# Patient Record
Sex: Male | Born: 1995 | Race: White | Hispanic: No | Marital: Married
Health system: Southern US, Community
[De-identification: ages and names within clinical notes are randomized; demographics above are authoritative.]

---

## 2018-11-29 ENCOUNTER — Emergency Department (HOSPITAL_COMMUNITY): Payer: BLUE CROSS/BLUE SHIELD

## 2018-11-29 ENCOUNTER — Encounter (HOSPITAL_COMMUNITY): Payer: Self-pay

## 2018-11-29 ENCOUNTER — Other Ambulatory Visit: Payer: Self-pay

## 2018-11-29 ENCOUNTER — Observation Stay (HOSPITAL_COMMUNITY)
Admission: EM | Admit: 2018-11-29 | Discharge: 2018-12-01 | Disposition: A | Discharge status: Home / Self Care | Payer: BLUE CROSS/BLUE SHIELD | Attending: Physician Assistant | Admitting: Physician Assistant

## 2018-11-29 DIAGNOSIS — S82142A Displaced bicondylar fracture of left tibia, initial encounter for closed fracture: Secondary | ICD-10-CM | POA: Diagnosis not present

## 2018-11-29 DIAGNOSIS — S0990XA Unspecified injury of head, initial encounter: Secondary | ICD-10-CM | POA: Diagnosis present

## 2018-11-29 DIAGNOSIS — R2689 Other abnormalities of gait and mobility: Secondary | ICD-10-CM | POA: Diagnosis not present

## 2018-11-29 DIAGNOSIS — T07XXXA Unspecified multiple injuries, initial encounter: Secondary | ICD-10-CM | POA: Diagnosis present

## 2018-11-29 DIAGNOSIS — Z23 Encounter for immunization: Secondary | ICD-10-CM | POA: Diagnosis not present

## 2018-11-29 DIAGNOSIS — S0101XA Laceration without foreign body of scalp, initial encounter: Secondary | ICD-10-CM | POA: Diagnosis not present

## 2018-11-29 DIAGNOSIS — S060X0A Concussion without loss of consciousness, initial encounter: Secondary | ICD-10-CM | POA: Diagnosis present

## 2018-11-29 DIAGNOSIS — R2681 Unsteadiness on feet: Secondary | ICD-10-CM | POA: Insufficient documentation

## 2018-11-29 DIAGNOSIS — S82401A Unspecified fracture of shaft of right fibula, initial encounter for closed fracture: Principal | ICD-10-CM

## 2018-11-29 DIAGNOSIS — M6281 Muscle weakness (generalized): Secondary | ICD-10-CM | POA: Insufficient documentation

## 2018-11-29 DIAGNOSIS — S82102A Unspecified fracture of upper end of left tibia, initial encounter for closed fracture: Secondary | ICD-10-CM

## 2018-11-29 LAB — SAMPLE TO BLOOD BANK

## 2018-11-29 LAB — CBC
HCT: 43.9 % (ref 39.0–52.0)
Hemoglobin: 15.1 g/dL (ref 13.0–17.0)
MCH: 31.9 pg (ref 26.0–34.0)
MCHC: 34.4 g/dL (ref 30.0–36.0)
MCV: 92.6 fL (ref 80.0–100.0)
Platelets: 188 10*3/uL (ref 150–400)
RBC: 4.74 MIL/uL (ref 4.22–5.81)
RDW: 12.5 % (ref 11.5–15.5)
WBC: 6.1 10*3/uL (ref 4.0–10.5)
nRBC: 0 % (ref 0.0–0.2)

## 2018-11-29 LAB — I-STAT CHEM 8, ED
BUN: 17 mg/dL (ref 6–20)
Calcium, Ion: 1.18 mmol/L (ref 1.15–1.40)
Chloride: 103 mmol/L (ref 98–111)
Creatinine, Ser: 0.8 mg/dL (ref 0.61–1.24)
GLUCOSE: 101 mg/dL — AB (ref 70–99)
HCT: 45 % (ref 39.0–52.0)
Hemoglobin: 15.3 g/dL (ref 13.0–17.0)
Potassium: 3.5 mmol/L (ref 3.5–5.1)
Sodium: 140 mmol/L (ref 135–145)
TCO2: 28 mmol/L (ref 22–32)

## 2018-11-29 LAB — COMPREHENSIVE METABOLIC PANEL
ALK PHOS: 56 U/L (ref 38–126)
ALT: 25 U/L (ref 0–44)
AST: 54 U/L — ABNORMAL HIGH (ref 15–41)
Albumin: 4.3 g/dL (ref 3.5–5.0)
Anion gap: 11 (ref 5–15)
BUN: 12 mg/dL (ref 6–20)
CO2: 23 mmol/L (ref 22–32)
Calcium: 9.4 mg/dL (ref 8.9–10.3)
Chloride: 105 mmol/L (ref 98–111)
Creatinine, Ser: 0.9 mg/dL (ref 0.61–1.24)
GFR calc Af Amer: >60 mL/min (ref >60)
GFR calc non Af Amer: >60 mL/min (ref >60)
GLUCOSE: 98 mg/dL (ref 70–99)
Potassium: 3.6 mmol/L (ref 3.5–5.1)
Sodium: 139 mmol/L (ref 135–145)
Total Bilirubin: 0.8 mg/dL (ref 0.3–1.2)
Total Protein: 6.9 g/dL (ref 6.5–8.1)

## 2018-11-29 LAB — PROTIME-INR
INR: 0.98
Prothrombin Time: 12.9 seconds (ref 11.4–15.2)

## 2018-11-29 LAB — ETHANOL: Alcohol, Ethyl (B): <10 mg/dL (ref <10)

## 2018-11-29 LAB — I-STAT CG4 LACTIC ACID, ED: Lactic Acid, Venous: 1.44 mmol/L (ref 0.5–1.9)

## 2018-11-29 LAB — CDS SEROLOGY

## 2018-11-29 MED ORDER — TETANUS-DIPHTH-ACELL PERTUSSIS 5-2.5-18.5 LF-MCG/0.5 IM SUSP
0.5000 mL | Freq: Once | INTRAMUSCULAR | Status: AC
  Administered 2018-11-29: 0.5 mL via INTRAMUSCULAR
  Filled 2018-11-29: qty 0.5

## 2018-11-29 MED ORDER — MORPHINE SULFATE (PF) 4 MG/ML IV SOLN
4.0000 mg | Freq: Once | INTRAVENOUS | Status: AC
  Administered 2018-11-29: 4 mg via INTRAVENOUS
  Filled 2018-11-29: qty 1

## 2018-11-29 MED ORDER — LORAZEPAM 2 MG/ML IJ SOLN
0.5000 mg | Freq: Once | INTRAMUSCULAR | Status: AC
  Administered 2018-11-29: 0.5 mg via INTRAVENOUS
  Filled 2018-11-29: qty 1

## 2018-11-29 MED ORDER — LIDOCAINE-EPINEPHRINE (PF) 2 %-1:200000 IJ SOLN
20.0000 mL | Freq: Once | INTRAMUSCULAR | Status: AC
  Administered 2018-11-29: 20 mL via INTRADERMAL
  Filled 2018-11-29: qty 20

## 2018-11-29 MED ORDER — ONDANSETRON HCL 4 MG/2ML IJ SOLN: 4.0000 mg | Freq: Once | INTRAMUSCULAR | Status: DC

## 2018-11-29 MED ORDER — ONDANSETRON HCL 4 MG/2ML IJ SOLN
4.0000 mg | Freq: Once | INTRAMUSCULAR | Status: AC
  Administered 2018-11-29: 4 mg via INTRAVENOUS
  Filled 2018-11-29: qty 2

## 2018-11-29 MED ORDER — IOHEXOL 300 MG/ML  SOLN
100.0000 mL | Freq: Once | INTRAMUSCULAR | Status: AC | PRN
  Administered 2018-11-29: 100 mL via INTRAVENOUS

## 2018-11-29 MED ORDER — HYDROMORPHONE HCL 1 MG/ML IJ SOLN
1.0000 mg | Freq: Once | INTRAMUSCULAR | Status: AC
  Administered 2018-11-29: 1 mg via INTRAVENOUS
  Filled 2018-11-29: qty 1

## 2018-11-29 MED ORDER — SODIUM CHLORIDE 0.9 % IV BOLUS
1000.0000 mL | Freq: Once | INTRAVENOUS | Status: AC
  Administered 2018-11-29: 1000 mL via INTRAVENOUS

## 2018-11-29 NOTE — ED Notes (Signed)
Pt returned from radiology.

## 2018-11-29 NOTE — ED Notes (Addendum)
Pt has a 2 cm laceration to the left side of the head, bleeding controlled.  PT has a 3-4 cm laceration to the left side of the forehead, bleeding controlled.  PT has laceration/puncture wound to left elbow, bleeding controlled.  PT has superficial lacerations to lower lip.  PT has superficial abrasion to left leg distal to knee.  PT has ecchymosis to left chest next to shoulder.
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
## 2018-11-30 ENCOUNTER — Other Ambulatory Visit: Payer: Self-pay

## 2018-11-30 ENCOUNTER — Encounter (HOSPITAL_COMMUNITY): Payer: Self-pay | Admitting: Emergency Medicine

## 2018-11-30 LAB — CBC
HCT: 39.1 % (ref 39.0–52.0)
Hemoglobin: 13.1 g/dL (ref 13.0–17.0)
MCH: 30.8 pg (ref 26.0–34.0)
MCHC: 33.5 g/dL (ref 30.0–36.0)
MCV: 91.8 fL (ref 80.0–100.0)
PLATELETS: 175 10*3/uL (ref 150–400)
RBC: 4.26 MIL/uL (ref 4.22–5.81)
RDW: 12.7 % (ref 11.5–15.5)
WBC: 11.7 10*3/uL — ABNORMAL HIGH (ref 4.0–10.5)
nRBC: 0 % (ref 0.0–0.2)

## 2018-11-30 LAB — BASIC METABOLIC PANEL
Anion gap: 8 (ref 5–15)
BUN: 11 mg/dL (ref 6–20)
CO2: 25 mmol/L (ref 22–32)
Calcium: 8.9 mg/dL (ref 8.9–10.3)
Chloride: 104 mmol/L (ref 98–111)
Creatinine, Ser: 0.89 mg/dL (ref 0.61–1.24)
GFR calc Af Amer: >60 mL/min (ref >60)
Glucose, Bld: 123 mg/dL — ABNORMAL HIGH (ref 70–99)
Potassium: 3.6 mmol/L (ref 3.5–5.1)
Sodium: 137 mmol/L (ref 135–145)

## 2018-11-30 LAB — URINALYSIS, ROUTINE W REFLEX MICROSCOPIC
Bilirubin Urine: NEGATIVE
Glucose, UA: NEGATIVE mg/dL
HGB URINE DIPSTICK: NEGATIVE
Ketones, ur: NEGATIVE mg/dL
Leukocytes, UA: NEGATIVE
Nitrite: NEGATIVE
Protein, ur: NEGATIVE mg/dL
Specific Gravity, Urine: 1.019 (ref 1.005–1.030)
pH: 6 (ref 5.0–8.0)

## 2018-11-30 LAB — HIV ANTIBODY (ROUTINE TESTING W REFLEX): HIV Screen 4th Generation wRfx: NONREACTIVE

## 2018-11-30 MED ORDER — POLYETHYLENE GLYCOL 3350 17 G PO PACK
17.0000 g | PACK | Freq: Every day | ORAL | Status: DC
  Administered 2018-11-30 – 2018-12-01 (×2): 17 g via ORAL
  Filled 2018-11-30 (×2): qty 1

## 2018-11-30 MED ORDER — DOCUSATE SODIUM 100 MG PO CAPS
100.0000 mg | ORAL_CAPSULE | Freq: Two times a day (BID) | ORAL | Status: DC
  Administered 2018-11-30 – 2018-12-01 (×3): 100 mg via ORAL
  Filled 2018-11-30 (×3): qty 1

## 2018-11-30 MED ORDER — BACITRACIN ZINC 500 UNIT/GM EX OINT
TOPICAL_OINTMENT | Freq: Two times a day (BID) | CUTANEOUS | Status: DC
  Administered 2018-11-30 – 2018-12-01 (×3): via TOPICAL
  Filled 2018-11-30: qty 28.4

## 2018-11-30 MED ORDER — OXYCODONE HCL 5 MG PO TABS
5.0000 mg | ORAL_TABLET | ORAL | Status: DC | PRN
  Administered 2018-11-30 – 2018-12-01 (×4): 10 mg via ORAL
  Filled 2018-11-30 (×4): qty 2

## 2018-11-30 MED ORDER — ONDANSETRON HCL 4 MG/2ML IJ SOLN: 4.0000 mg | Freq: Four times a day (QID) | INTRAMUSCULAR | Status: DC | PRN

## 2018-11-30 MED ORDER — ENOXAPARIN SODIUM 40 MG/0.4ML ~~LOC~~ SOLN
40.0000 mg | SUBCUTANEOUS | Status: DC
  Administered 2018-11-30: 40 mg via SUBCUTANEOUS
  Filled 2018-11-30: qty 0.4

## 2018-11-30 MED ORDER — DEXTROSE IN LACTATED RINGERS 5 % IV SOLN
INTRAVENOUS | Status: DC
  Administered 2018-11-30 (×2): via INTRAVENOUS

## 2018-11-30 MED ORDER — ONDANSETRON 4 MG PO TBDP: 4.0000 mg | ORAL_TABLET | Freq: Four times a day (QID) | ORAL | Status: DC | PRN

## 2018-11-30 MED ORDER — ACETAMINOPHEN 325 MG PO TABS
650.0000 mg | ORAL_TABLET | Freq: Four times a day (QID) | ORAL | Status: DC
  Administered 2018-11-30 – 2018-12-01 (×5): 650 mg via ORAL
  Filled 2018-11-30 (×5): qty 2

## 2018-11-30 MED ORDER — HYDROMORPHONE HCL 1 MG/ML IJ SOLN: 1.0000 mg | INTRAMUSCULAR | Status: DC | PRN

## 2018-11-30 MED ORDER — METHOCARBAMOL 500 MG PO TABS
500.0000 mg | ORAL_TABLET | Freq: Four times a day (QID) | ORAL | Status: DC
  Administered 2018-11-30 – 2018-12-01 (×5): 500 mg via ORAL
  Filled 2018-11-30 (×5): qty 1

## 2018-11-30 MED ORDER — HYDROMORPHONE HCL 1 MG/ML IJ SOLN
1.0000 mg | INTRAMUSCULAR | Status: DC | PRN
  Administered 2018-11-30: 1 mg via INTRAVENOUS
  Filled 2018-11-30: qty 1

## 2018-11-30 MED ORDER — OXYCODONE HCL 5 MG PO TABS
10.0000 mg | ORAL_TABLET | ORAL | Status: DC | PRN
  Administered 2018-11-30: 10 mg via ORAL
  Filled 2018-11-30: qty 2
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
## 2018-11-30 NOTE — Plan of Care (Signed)
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
## 2018-11-30 NOTE — ED Notes (Signed)
Report given to 5N RN. All questions answered 
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
## 2018-11-30 NOTE — Evaluation (Signed)
Physical Therapy Evaluation Patient Details Name: Bill Barnes MRN: [id number] DOB: Jan 10, 1996 Today's Date: 11/30/2018   History of Present Illness  Patient is a 23 y/o male presenting to the ED on 11/29/18 due to [facility] with resultant Right fibular fracture, Left tibial plateau fracture, Concussion, Facial lacerations. No significant PMH.     Clinical Impression  Patient admitted with the above listed diagnosis. IND with all mobility and ADLs prior to admission. Today requiring close min guard with consistent cueing throughout session with all mobility for safety and sequencing with limited carryover due to delayed recall and reduced short term memory - likely an effect from concussion. Wife present for session with wife educated on safety with all mobility with handouts and gait belt distributed - will need supervision from family for all mobility due to safety concerns and reduced memory. Able to ambulate in hallway and up/down 2 steps x 2 with B axillary crutches. Depending on POC for MD follow-up at discharge may benefit from OPPT to progress gait mechanics and weight bearing. PT to continue to follow.     Follow Up Recommendations Outpatient PT;Follow surgeon's recommendation for DC plan and follow-up therapies;Supervision for mobility/OOB    Equipment Recommendations  3in1 (PT)    Recommendations for Other Services       Precautions / Restrictions Precautions Precautions: Fall Required Braces or Orthoses: Knee Immobilizer - Left Restrictions Weight Bearing Restrictions: Yes RLE Weight Bearing: Weight bearing as tolerated LLE Weight Bearing: Weight bearing as tolerated      Mobility  Bed Mobility Overal bed mobility: Needs Assistance Bed Mobility: Supine to Sit     Supine to sit: Min assist     General bed mobility comments: light Min A for LE management  Transfers Overall transfer level: Needs assistance Equipment used: Crutches Transfers: Sit to/from [employer] Sit to Stand: Min guard Stand pivot transfers: Min guard       General transfer comment: min guard for safety; consistent verbal cueing for safety/sequencing  Ambulation/Gait Ambulation/Gait assistance: Min guard Gait Distance (Feet): 100 Feet Assistive device: Crutches Gait Pattern/deviations: Step-to pattern;Decreased weight shift to left(l[place] to L LE weight bearing ) Gait velocity: decreased   General Gait Details: consistent cueing for safety and sequencing  Stairs Stairs: Yes Stairs assistance: Min guard;Min assist Stair Management: No rails;With crutches;Step to pattern Number of Stairs: 2(2 reps) General stair comments: step to pattern with R LE weight bearing only; cueing for safety and sequencing; delayed recall; handout given   Wheelchair Mobility    Modified Rankin (Stroke Patients Only)       Balance Overall balance assessment: Mild deficits observed, not formally tested                                           Pertinent Vitals/Pain Pain Assessment: Faces Faces Pain Scale: Hurts even more Pain Location: L knee Pain Descriptors / Indicators: Aching;Discomfort;Grimacing;Guarding Pain Intervention(s): Monitored during session;Limited activity within patient's tolerance;Repositioned    Home Living Family/patient expects to be discharged to:: Private residence Living Arrangements: Spouse/significant other Available Help at Discharge: Family Type of Home: House Home Access: Stairs to enter(3 steps) Entrance Stairs-Rails: Left Entrance Stairs-Number of Steps: 3 Home Layout: One level Home Equipment: Crutches;Walker - 2 wheels      Prior Function Level of Independence: Independent  Hand Dominance   Dominant Hand: Right    Extremity/Trunk Assessment   Upper Extremity Assessment Upper Extremity Assessment: Defer to OT evaluation    Lower Extremity Assessment Lower Extremity  Assessment: Generalized weakness;RLE deficits/detail;LLE deficits/detail RLE Deficits / Details: good ability to bear weight  LLE Deficits / Details: does not tolerate weight bearing    Cervical / Trunk Assessment Cervical / Trunk Assessment: Normal  Communication   Communication: No difficulties  Cognition Arousal/Alertness: Awake/alert Behavior During Therapy: WFL for tasks assessed/performed Overall Cognitive Status: Impaired/Different from baseline Area of Impairment: Memory;Following commands;Safety/judgement;Problem solving                     Memory: Decreased short-term memory(Able to recall Monday event and before.) Following Commands: Follows one step commands consistently;Follows multi-step commands with increased time Safety/Judgement: Decreased awareness of safety;Decreased awareness of deficits   Problem Solving: Slow processing;Difficulty sequencing;Requires verbal cues General Comments: patient with delayed recall of sequencing; requires consistent verbal cueing       General Comments General comments (skin integrity, edema, etc.): wife present for session with wife educated on all mobility    Exercises     Assessment/Plan    PT Assessment Patient needs continued PT services  PT Problem List Decreased strength;Decreased activity tolerance;Decreased balance;Decreased mobility;Decreased knowledge of use of DME;Decreased safety awareness       PT Treatment Interventions DME instruction;Gait training;Stair training;Functional mobility training;Therapeutic activities;Therapeutic exercise;Balance training;Patient/family education    PT Goals (Current goals can be found in the Care Plan section)  Acute Rehab PT Goals Patient Stated Goal: return home today PT Goal Formulation: With patient Time For Goal Achievement: 12/14/18 Potential to Achieve Goals: Good    Frequency Min 3X/week   Barriers to discharge        Co-evaluation PT/OT/SLP  Co-Evaluation/Treatment: Yes Reason for Co-Treatment: Complexity of the patient's impairments (multi-system involvement);Necessary to address cognition/behavior during functional activity;For patient/therapist safety;To address functional/ADL transfers PT goals addressed during session: Mobility/safety with mobility;Balance;Proper use of DME;Strengthening/ROM         AM-PAC PT "6 Clicks" Mobility  Outcome Measure Help needed turning from your back to your side while in a flat bed without using bedrails?: A Little Help needed moving from lying on your back to sitting on the side of a flat bed without using bedrails?: A Little Help needed moving to and from a bed to a chair (including a wheelchair)?: A Little Help needed standing up from a chair using your arms (e.g., wheelchair or bedside chair)?: A Little Help needed to walk in hospital room?: A Little Help needed climbing 3-5 steps with a railing? : A Little 6 Click Score: 18    End of Session Equipment Utilized During Treatment: Gait belt Activity Tolerance: Patient tolerated treatment well Patient left: in chair;with call bell/phone within reach;with family/visitor present Nurse Communication: Mobility status PT Visit Diagnosis: Unsteadiness on feet (R26.81);Other abnormalities of gait and mobility (R26.89);Muscle weakness (generalized) (M62.81)    Time: 9163-8466 PT Time Calculation (min) (ACUTE ONLY): 41 min   Charges:   PT Evaluation $PT Eval Moderate Complexity: 1 Mod PT Treatments $Gait Training: 8-22 mins        Kipp Laurence, PT, DPT Supplemental Physical Therapist 11/30/18 1:23 PM Pager: [phone] Office: [phone]
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
## 2018-12-01 MED ORDER — OXYCODONE HCL 5 MG PO TABS: 5.0000 mg | ORAL_TABLET | Freq: Four times a day (QID) | ORAL | 0 refills | Status: AC | PRN

## 2018-12-01 MED ORDER — POLYETHYLENE GLYCOL 3350 17 G PO PACK: 17.0000 g | PACK | Freq: Every day | ORAL | 0 refills | Status: AC | PRN

## 2018-12-01 MED ORDER — ACETAMINOPHEN 325 MG PO TABS: 650.0000 mg | ORAL_TABLET | Freq: Four times a day (QID) | ORAL | Status: AC | PRN

## 2018-12-01 MED ORDER — METHOCARBAMOL 500 MG PO TABS: 500.0000 mg | ORAL_TABLET | Freq: Four times a day (QID) | ORAL | 0 refills | Status: AC | PRN

## 2018-12-01 MED ORDER — DOCUSATE SODIUM 100 MG PO CAPS: 100.0000 mg | ORAL_CAPSULE | Freq: Two times a day (BID) | ORAL | 0 refills | Status: AC

## 2018-12-01 MED ORDER — BACITRACIN ZINC 500 UNIT/GM EX OINT: TOPICAL_OINTMENT | Freq: Two times a day (BID) | CUTANEOUS | 0 refills | Status: AC

## 2018-12-01 NOTE — Progress Notes (Signed)
Discharge instructions (including medications) discussed with and copy provided to patient/caregiver 

## 2018-12-01 NOTE — Discharge Instructions (Signed)
Pain Medicine Instructions You may need pain medicine after an injury or illness. Two common types of pain medicine are:  Opioid pain medicine. These may be called opioids.  Non-opioid pain medicine. This includes NSAIDs. It is important to follow your doctor's instructions when you are taking pain medicine. Doing this can keep yourself and others safe. How can pain medicine affect me? Pain medicine may not make all of your pain go away. It should make you comfortable enough to:  Move.  Breathe.  Do normal activities. Opioids can cause side effects, such as:  Trouble pooping (constipation).  Feeling sick to your stomach (nausea).  Throwing up (vomiting).  Feeling very sleepy.  Confusion.  Taking the medicine for nonmedical reasons even though taking it hurts your health and well-being (opioid use disorder).  Trouble breathing (respiratory depression). Taking opioids for longer than 3 days raises your risk of these side effects. Taking opioids for a long time can affect how well you can do daily tasks. Taking them for a long time also puts you at risk for:  Car crashes.  Depression.  Suicide.  Heart attack.  Taking too much of the medicine (overdose). This can lead to death. What should I do to stay safe while taking pain medicine? Take your medicine as told  Take pain medicine exactly as told by your doctor. Take it only when you need it.  Write down the times when you take your pain medicine. Look at the times before you take your next dose.  Take other over-the-counter or prescription medicines only as told by your doctor. ? If your pain medicine has acetaminophen in it, do not take any other acetaminophen while you are taking this medicine. Too much can damage the liver.  Get pain medicine prescriptions from only one doctor. Avoid certain activities While you are taking prescription pain medicine, and for 8 hours after your last dose:  Do not drive.  Do  not use machinery.  Do not use power tools.  Do not sign legal documents.  Do not drink alcohol.  Do not take sleeping pills.  Do not take care of children by yourself.  Do not do any activities that involve climbing or being in high places.  Do not go into any body of water unless there is an adult nearby who can watch you and help you if needed. This includes: ? [place]. ? Rivers. ? Oceans. ? Spas. ? Swimming pools.  Keep others safe  Store your medicine as told by your doctor. Keep it where children and pets cannot reach it.  Do not share your pain medicine with anyone.  Do not save any leftover pills. If you have leftover pills, you can: ? Bring them to a take-back program. ? Bring them to a pharmacy that has a drug disposal container. ? Throw them in the trash. Check the medicine label or package insert to see if it is safe to throw it out. If it is safe, take the medicine out of the container. Mix it with something that makes it unusable, such as pet waste. Then put the medicine in the trash. General instructions  Talk with your doctor about other ways to manage your pain.  If you have trouble pooping: ? Drink enough fluid to keep your pee (urine) pale yellow. ? Use a poop (stool) softener as told by your doctor. ? Eat more fruits and vegetables.  Keep all follow-up visits as told by your doctor. This is important. Contact a  doctor about other ways to manage your pain.  · If you have trouble pooping:  ? Drink enough fluid to keep your pee (urine) pale yellow.  ? Use a poop (stool) softener as told by your doctor.  ? Eat more fruits and vegetables.  · Keep all follow-up visits as told by your doctor. This is important.  Contact a doctor if:  · Your medicine is not helping with your pain.  · You have a rash.  · You feel depressed.  Get help right away if:  Seek medical care right away if you are taking pain medicines and you (or people close to you) notice any of the following:  · Trouble breathing.  · Breathing that is shorter than normal.  · Breathing that is more shallow than normal.  · Confusion.  · Sleepiness.  · Trouble staying awake.  · Feeling sick to your stomach.  · Throwing up.  · Your skin or lips turning pale or bluish in color.  · Tongue swelling.  If you ever  feel like you may hurt yourself or others, or have thoughts about taking your own life, get help right away. Go to your nearest emergency department or call:  · Your local emergency services (911 in the [place]  · A suicide crisis helpline, such as the National Suicide Prevention Lifeline at 1-800-273-8255. This is open 24 hours a day.  Summary  · Take your pain medicine exactly as told by your doctor.  · Pain medicine can help lower your pain. It may also cause side effects.  · Talk with your doctor about other ways to manage your pain.  · Follow your doctor's instructions about how to take your pain medicine and keep others safe. Ask what activities you should avoid while taking pain medicine.  This information is not intended to replace advice given to you by your health care provider. Make sure you discuss any questions you have with your health care provider.  Document Released: 04/19/2008 Document Revised: 06/13/2017 Document Reviewed: 06/13/2017  [employer]vier Interactive Patient Education © 2019 [employer]
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
## 2018-12-04 ENCOUNTER — Encounter: Payer: Self-pay | Admitting: Physical Therapy

## 2018-12-04 ENCOUNTER — Ambulatory Visit (INDEPENDENT_AMBULATORY_CARE_PROVIDER_SITE_OTHER): Payer: BLUE CROSS/BLUE SHIELD | Admitting: Physical Therapy

## 2018-12-04 ENCOUNTER — Other Ambulatory Visit: Payer: Self-pay

## 2018-12-04 DIAGNOSIS — R2689 Other abnormalities of gait and mobility: Secondary | ICD-10-CM

## 2018-12-04 DIAGNOSIS — R600 Localized edema: Secondary | ICD-10-CM | POA: Diagnosis not present

## 2018-12-04 DIAGNOSIS — M79661 Pain in right lower leg: Secondary | ICD-10-CM | POA: Diagnosis not present

## 2018-12-04 DIAGNOSIS — M79605 Pain in left leg: Secondary | ICD-10-CM

## 2018-12-04 DIAGNOSIS — M25662 Stiffness of left knee, not elsewhere classified: Secondary | ICD-10-CM
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
## 2018-12-04 NOTE — Patient Instructions (Signed)
Access Code: [email or web address]  URL: [email or web address]  Date: 12/04/2018  Prepared by: Moshe Cipro   Exercises  Quad Set - 10 reps - 1 sets - 5 sec hold - 3x daily - 7x weekly  Supine Gluteal Sets - 10 reps - 1 sets - 5 sec hold - 3x daily - 7x weekly  Supine Ankle Pumps - 10 reps - 1 sets - 3x daily - 7x weekly  Small Range Straight Leg Raise - 10 reps - 1 sets - 2x daily - 7x weekly  Supine Hip Abduction - 10 reps - 1 sets - 2x daily - 7x weekly  Supine Heel Slides - 10 reps - 1 sets - 2x daily - 7x weekly

## 2018-12-06 ENCOUNTER — Encounter: Payer: Self-pay | Admitting: Rehabilitative and Restorative Service Providers"

## 2018-12-06 ENCOUNTER — Ambulatory Visit (INDEPENDENT_AMBULATORY_CARE_PROVIDER_SITE_OTHER): Payer: BLUE CROSS/BLUE SHIELD | Admitting: Rehabilitative and Restorative Service Providers"

## 2018-12-06 DIAGNOSIS — M79605 Pain in left leg: Secondary | ICD-10-CM

## 2018-12-06 DIAGNOSIS — M79661 Pain in right lower leg: Secondary | ICD-10-CM | POA: Diagnosis not present

## 2018-12-06 DIAGNOSIS — R600 Localized edema: Secondary | ICD-10-CM | POA: Diagnosis not present

## 2018-12-06 DIAGNOSIS — M25662 Stiffness of left knee, not elsewhere classified: Secondary | ICD-10-CM | POA: Diagnosis not present

## 2018-12-06 DIAGNOSIS — R2689 Other abnormalities of gait and mobility: Secondary | ICD-10-CM
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
## 2018-12-11 ENCOUNTER — Other Ambulatory Visit: Payer: Self-pay

## 2018-12-11 ENCOUNTER — Encounter: Payer: Self-pay | Admitting: Occupational Therapy

## 2018-12-11 ENCOUNTER — Ambulatory Visit: Payer: BLUE CROSS/BLUE SHIELD | Attending: Physician Assistant | Admitting: Occupational Therapy

## 2018-12-11 DIAGNOSIS — R41840 Attention and concentration deficit: Secondary | ICD-10-CM | POA: Insufficient documentation
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
## 2018-12-12 ENCOUNTER — Ambulatory Visit (INDEPENDENT_AMBULATORY_CARE_PROVIDER_SITE_OTHER): Payer: BLUE CROSS/BLUE SHIELD | Admitting: Physical Therapy

## 2018-12-12 ENCOUNTER — Encounter: Payer: Self-pay | Admitting: Physical Therapy

## 2018-12-12 DIAGNOSIS — M25662 Stiffness of left knee, not elsewhere classified: Secondary | ICD-10-CM | POA: Diagnosis not present

## 2018-12-12 DIAGNOSIS — R2689 Other abnormalities of gait and mobility: Secondary | ICD-10-CM

## 2018-12-12 DIAGNOSIS — M79605 Pain in left leg: Secondary | ICD-10-CM | POA: Diagnosis not present

## 2018-12-12 DIAGNOSIS — M79661 Pain in right lower leg: Secondary | ICD-10-CM

## 2018-12-12 DIAGNOSIS — R600 Localized edema: Secondary | ICD-10-CM | POA: Diagnosis not present
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
## 2018-12-14 ENCOUNTER — Ambulatory Visit (INDEPENDENT_AMBULATORY_CARE_PROVIDER_SITE_OTHER): Payer: BLUE CROSS/BLUE SHIELD | Admitting: Physical Therapy

## 2018-12-14 ENCOUNTER — Encounter: Payer: Self-pay | Admitting: Physical Therapy

## 2018-12-14 DIAGNOSIS — R600 Localized edema: Secondary | ICD-10-CM

## 2018-12-14 DIAGNOSIS — M79605 Pain in left leg: Secondary | ICD-10-CM | POA: Diagnosis not present

## 2018-12-14 DIAGNOSIS — M79661 Pain in right lower leg: Secondary | ICD-10-CM | POA: Diagnosis not present

## 2018-12-14 DIAGNOSIS — R2689 Other abnormalities of gait and mobility: Secondary | ICD-10-CM

## 2018-12-14 DIAGNOSIS — M25662 Stiffness of left knee, not elsewhere classified: Secondary | ICD-10-CM | POA: Diagnosis not present
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
## 2018-12-20 ENCOUNTER — Encounter: Payer: BLUE CROSS/BLUE SHIELD | Admitting: Physical Therapy

## 2018-12-22 ENCOUNTER — Encounter: Payer: BLUE CROSS/BLUE SHIELD | Admitting: Physical Therapy

## 2018-12-26 ENCOUNTER — Encounter: Payer: BLUE CROSS/BLUE SHIELD | Admitting: Physical Therapy

## 2018-12-28 ENCOUNTER — Encounter: Payer: BLUE CROSS/BLUE SHIELD | Admitting: Physical Therapy

## 2019-01-02 ENCOUNTER — Encounter: Payer: BLUE CROSS/BLUE SHIELD | Admitting: Physical Therapy

## 2019-01-04 ENCOUNTER — Encounter: Payer: BLUE CROSS/BLUE SHIELD | Admitting: Rehabilitative and Restorative Service Providers"
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
